# Patient Record
Sex: Female | Born: 1960 | Hispanic: No | Marital: Married
Health system: Northeastern US, Academic
[De-identification: ages and names within clinical notes are randomized; demographics above are authoritative.]

## PROBLEM LIST (undated history)

## (undated) DIAGNOSIS — E039 Hypothyroidism, unspecified: Secondary | ICD-10-CM

## (undated) HISTORY — DX: Hypothyroidism, unspecified: E03.9

---

## 2003-06-13 ENCOUNTER — Other Ambulatory Visit: Admission: RE | Admit: 2003-06-13 | Discharge: 2003-06-13 | Payer: Self-pay | Admitting: Obstetrics and Gynecology

## 2003-09-14 ENCOUNTER — Encounter: Admission: RE | Admit: 2003-09-14 | Discharge: 2003-09-14 | Payer: Self-pay | Admitting: Internal Medicine

## 2004-10-29 ENCOUNTER — Other Ambulatory Visit: Admission: RE | Admit: 2004-10-29 | Discharge: 2004-10-29 | Payer: Self-pay | Admitting: Obstetrics and Gynecology

## 2004-11-01 ENCOUNTER — Encounter: Admission: RE | Admit: 2004-11-01 | Discharge: 2004-11-01 | Payer: Self-pay | Admitting: Internal Medicine

## 2004-11-06 ENCOUNTER — Encounter (INDEPENDENT_AMBULATORY_CARE_PROVIDER_SITE_OTHER): Payer: Self-pay | Admitting: Specialist

## 2004-11-06 ENCOUNTER — Inpatient Hospital Stay (HOSPITAL_COMMUNITY): Admission: RE | Admit: 2004-11-06 | Discharge: 2004-11-08 | Payer: Self-pay | Admitting: Obstetrics and Gynecology

## 2005-05-23 HISTORY — PX: THYROIDECTOMY: SHX17

## 2005-11-20 ENCOUNTER — Other Ambulatory Visit: Admission: RE | Admit: 2005-11-20 | Discharge: 2005-11-20 | Payer: Self-pay | Admitting: Obstetrics and Gynecology

## 2005-11-21 ENCOUNTER — Encounter: Admission: RE | Admit: 2005-11-21 | Discharge: 2005-11-21 | Payer: Self-pay | Admitting: Internal Medicine

## 2006-03-04 ENCOUNTER — Encounter: Admission: RE | Admit: 2006-03-04 | Discharge: 2006-03-04 | Payer: Self-pay | Admitting: Endocrinology

## 2006-12-29 ENCOUNTER — Ambulatory Visit (HOSPITAL_COMMUNITY): Admission: RE | Admit: 2006-12-29 | Discharge: 2006-12-29 | Payer: Self-pay | Admitting: Obstetrics and Gynecology

## 2008-01-25 ENCOUNTER — Ambulatory Visit (HOSPITAL_COMMUNITY): Admission: RE | Admit: 2008-01-25 | Discharge: 2008-01-25 | Payer: Self-pay | Admitting: Internal Medicine

## 2008-12-07 ENCOUNTER — Encounter (HOSPITAL_COMMUNITY): Admission: RE | Admit: 2008-12-07 | Discharge: 2009-03-07 | Payer: Self-pay | Admitting: Endocrinology

## 2009-01-26 ENCOUNTER — Ambulatory Visit (HOSPITAL_COMMUNITY): Admission: RE | Admit: 2009-01-26 | Discharge: 2009-01-26 | Payer: Self-pay | Admitting: Internal Medicine

## 2009-03-02 ENCOUNTER — Encounter (HOSPITAL_COMMUNITY): Admission: RE | Admit: 2009-03-02 | Discharge: 2009-04-26 | Payer: Self-pay | Admitting: Endocrinology

## 2010-04-15 ENCOUNTER — Ambulatory Visit (HOSPITAL_COMMUNITY): Admission: RE | Admit: 2010-04-15 | Discharge: 2010-04-15 | Payer: Self-pay | Admitting: Obstetrics and Gynecology

## 2010-09-15 ENCOUNTER — Encounter: Payer: Self-pay | Admitting: Internal Medicine

## 2010-09-15 ENCOUNTER — Encounter: Payer: Self-pay | Admitting: Endocrinology

## 2010-12-01 LAB — HCG, QUANTITATIVE, PREGNANCY: hCG, Beta Chain, Quant, S: <2 m[IU]/mL (ref <5)

## 2011-01-10 NOTE — H&P (Signed)
NAME:  Traci Reynolds, Traci Reynolds                ACCOUNT NO.:  [id number]   MEDICAL RECORD NO.:  [id number]          PATIENT TYPE:  INP   LOCATION:  NA                            [facility]   PHYSICIAN:  Janine Limbo, M.D.DATE OF BIRTH:  23-Dec-1960   DATE OF ADMISSION:  11/06/2004  DATE OF DISCHARGE:                                HISTORY & PHYSICAL   HISTORY OF PRESENT ILLNESS:  The patient is a 50 year old female, para 0-0-2-  0, who presents for an abdominal myomectomy.  The patient has a known  history of large fibroids.  An ultrasound showed that her fibroids measure  3.2 cm, 2.7 cm, 6.0 cm, and 3.8 cm.  Other fibroids were also noted.  The  patient had an endometrial biopsy performed that was benign.  Her most  recent Pap smear was within normal limits.  The patient complains of heavy  menstrual cycles.  Her TSH, prolactin and FSH have all been normal in the  past.   PAST OBSTETRICAL HISTORY:  The patient has had two elective pregnancy  terminations.   PAST MEDICAL HISTORY:  The patient has had a D&C in the past.  She is  otherwise reasonably healthy.   DRUG ALLERGIES:  No known drug allergies.   SOCIAL HISTORY:  The patient denies cigarette use.   REVIEW OF SYSTEMS:  Please see history of present illness.   FAMILY HISTORY:  The patient has a family history of hypertension.  She also  has a family history of cancer.   ADMISSION PHYSICAL EXAMINATION:  VITAL SIGNS:  Weight is 163 pounds.  HEENT:  Within normal limits.  CHEST:  Clear.  CARDIAC:  Regular rate and rhythm.  BREASTS:  Without masses.  ABDOMEN:  Nontender.  EXTREMITIES:  Within normal limits.  NEUROLOGIC:  Grossly normal.  PELVIC:  External genitalia is normal.  Vagina is normal.  Cervix is  nontender.  The uterus is 16 weeks' size, irregular and nontender.  Adnexa:  No masses.  Rectovaginal exam confirms.   ASSESSMENT:  1.  Fibroid uterus.  2.  Menorrhagia.  3.  Desires to maintain her childbearing  potential.   PLAN:  The patient will undergo an abdominal myomectomy.  She understands  the indications for her procedure and she accepts the risks of, but not  limited to, anesthetic complications, bleeding, infection, and possible  damage to the surrounding organs.      AVS/MEDQ  D:  11/04/2004  T:  11/04/2004  Job:  [id number]

## 2011-01-10 NOTE — Discharge Summary (Signed)
NAME:  Traci Reynolds, Traci Reynolds                ACCOUNT NO.:  [id number]   MEDICAL RECORD NO.:  [id number]          PATIENT TYPE:  INP   LOCATION:  9315                          [facility]ILITY:  [facility]   PHYSICIAN:  Janine Limbo, M.D.DATE OF BIRTH:  07/15/1961   DATE OF ADMISSION:  11/06/2004  DATE OF DISCHARGE:  11/08/2004                                 DISCHARGE SUMMARY   DISCHARGE DIAGNOSES:  1.  Fibroid uterus.  2.  Menorrhagia.  3.  Desire to maintain childbearing potential.   OPERATION:  On the date of admission the patient underwent an abdominal  myomectomy with the placement of a JP drain, tolerating the procedure well.  The patient was found to have a 16-week-size fibroid uterus along with  normal-appearing tubes and ovaries bilaterally. The patient had 9 fibroids  removed from her uterus with the largest measuring 11.5 cm x 9.0 cm.   HISTORY OF PRESENT ILLNESS:  Ms. Dewalt is a 50 year old female para 0-0-2-  0 who presents for abdominal myomectomy because of symptoms uterine  fibroids. Please see the patient's dictated History and Physical Examination  for details.   PREOPERATIVE PHYSICAL EXAMINATION:  VITAL SIGNS:  Weight 163 pounds.  GENERAL:  Within normal limits.  PELVIC:  External genitalia is normal, vagina is normal, cervix is  nontender. Uterus is 15 weeks size, irregular, and nontender. Adnexa without  masses. Rectovaginal exam confirms.   HOSPITAL COURSE:  On the date of admission the patient underwent  aforementioned procedures, tolerating them well. Postoperative course was  marked by a brief time of elevated blood pressure to a maximum of 155/98.  However, this subsided with time. Additionally, the patient had a maximum  temperature of 100.7; however, this is not uncommon with myomectomy and she  quickly defervesced. Postoperative hemoglobin was 9.8 (preoperative  hemoglobin 13.0). The patient, by postoperative day #2, had resumed bowel  and bladder function, was  stable with her decreased hemoglobin, and  therefore deemed ready for discharge home.   DISCHARGE MEDICATIONS:  1.  Phenergan 25 mg one tablet q.6h. as needed for nausea.  2.  Colace 100 mg one tablet twice daily until bowel movements are regular.  3.  Ibuprofen 600 mg one tablet with food q.6h. for 3 days, then as needed      for pain.  4.  Ferrous sulfate 325 mg one tablet twice daily for 6 weeks.  5.  Vicodin one to two tablets q.4-6h. as needed for pain.   FOLLOW-UP:  The patient is scheduled for a 6 weeks postoperative visit with  Dr. Stefano Gaul on December 16, 2004 at 10:30 a.m.   DISCHARGE INSTRUCTIONS:  The patient was given a copy of [facility] postoperative instruction sheet. She was further advised to avoid  driving for 2 weeks, heavy lifting for 4 weeks, and intercourse for 6 weeks.  The patient's diet was without restriction.   FINAL PATHOLOGY:  Myelomas:  Fragments of leiomyomata.      EJP/MEDQ  D:  12/04/2004  T:  12/04/2004  Job:  [id number]

## 2011-01-10 NOTE — Op Note (Signed)
NAME:  Traci Reynolds, Traci Reynolds                ACCOUNT NO.:  [id number]   MEDICAL RECORD NO.:  [id number]          PATIENT TYPE:  INP   LOCATION:  9399                          [facility]:  [facility]   PHYSICIAN:  Janine Limbo, M.D.DATE OF BIRTH:  1961-03-05   DATE OF PROCEDURE:  11/06/2004  DATE OF DISCHARGE:                                 OPERATIVE REPORT   PREOPERATIVE DIAGNOSES:  1.  Fibroid uterus.  2.  Menorrhagia.  3.  Desires to maintain childbearing potential.   POSTOPERATIVE DIAGNOSES:  1.  Fibroid uterus.  2.  Menorrhagia.  3.  Desires to maintain childbearing potential.   PROCEDURE:  Multiple abdominal myomectomy.   SURGEON:  Janine Limbo.  Elmira J. Adline Peals., was the first  assistant.   ANESTHETIC:  General.   DISPOSITION:  Ms. Traci Reynolds is a 50 year old female, para 0-0-2-0, who  presents with the above-mentioned diagnosis.  The patient desires myomectomy because of her above symptoms.  She  understands the indications for  her procedure and she accepts the risk of,  but not limited to, anesthetic complications, bleeding, infection, and  possible damage to the surrounding organs.   FINDINGS:  The patient had a 16-week size fibroid uterus.  A total of nine  fibroids were removed, and the largest fibroid measured 11.5 by 9 cm.  The  fallopian tubes and the ovaries were normal.  The bowel appeared normal.  The myometrium was penetrated deeply as part of our dissection, and  therefore I would recommend that a cesarean section be performed at the time  of delivery of infant.   PROCEDURE:  The patient was taken to the operating room, where a general  anesthetic was given.  The patient's abdomen, perineum, and vagina were  prepped with multiple layers of Betadine.  A Foley catheter was placed in  the bladder.  The patient was sterilely draped.  The lower abdomen was  injected with 0.5% Marcaine with epinephrine.  A low transverse incision was  made in the abdomen and  the incision was carried sharply through the  subcutaneous tissue, the fascia, and the anterior peritoneum.  The fibroid  uterus was elevated into our operative field.  Multiple fibroids were noted  as mentioned above.  We began our procedure by injecting the fibroids on the  anterior separate surface of the uterus with a diluted solution of Pitressin  and saline.  An incision was made in the serosa of the uterus and the  incision was carried down to the level of the fibroid.  The fibroid was then  sharply and bluntly removed from the myometrium.  Three fibroids were  removed from the anterior incision.  The defects in the uterus were then  closed using figure-of-eight sutures of 0 Vicryl.  The serosa of the uterus  was closed using a baseball stitch of 3-0 Vicryl.  We then removed  multiple fibroids from the posterior fundus of the uterus.  The largest  fibroid was located on the posterior fundus of the uterus.  The large  fibroid was injected with a diluted solution of Pitressin  and saline.  The  Bovie cautery was used to make an incision in the serosa of the uterus, and  the large fibroid was removed from the fundus of the uterus.  Multiple other  fibroids were removed from this incision in the uterus.  The defect in the  uterus was again closed using deep sutures of 0 Vicryl.  The serosa of the  uterus was closed using a baseball suture of 3-0 Vicryl.  Care was taken not  to damage the fallopian tubes.  Hemostasis was noted to be adequate at this  point.  The pelvis was vigorously irrigated.  Interceed was placed on all  incisions on the uterus.  The uterus was returned to the pelvis.  Hemostasis  was confirmed throughout.  We closed the anterior peritoneum using a running  suture of 3-0 Vicryl.  The abdominal musculature and the fascia were  irrigated.  The fascia was closed using a running suture of 0 Vicryl,  followed by three interrupted sutures of 0 Vicryl.  A Jackson-Pratt drain   was placed in the subcutaneous layer and brought out through the right lower  quadrant.  It was sutured into place using silk suture.  The subcutaneous  layer was closed using 3-0 Vicryl.  The skin was reapproximated using a  subcuticular suture of 3-0 Monocryl.  Sponge, needle, and instrument counts  were correct on two occasions.  The estimated blood loss for the procedure  was 250 mL.  The patient was at 150 mL of urine output.  Her urine was noted  to be clear.  She received approximately 1700 mL of IV fluids.  The patient  was taken to the recovery room in stable condition.  There were no  complications.      AVS/MEDQ  D:  11/06/2004  T:  11/06/2004  Job:  [id number]

## 2011-02-17 ENCOUNTER — Other Ambulatory Visit: Payer: Self-pay | Admitting: Obstetrics and Gynecology

## 2011-02-17 DIAGNOSIS — Z1231 Encounter for screening mammogram for malignant neoplasm of breast: Secondary | ICD-10-CM

## 2011-04-24 ENCOUNTER — Ambulatory Visit (HOSPITAL_COMMUNITY)
Admission: RE | Admit: 2011-04-24 | Discharge: 2011-04-24 | Disposition: A | Discharge status: Home / Self Care | Payer: BC Managed Care – PPO | Source: Ambulatory Visit | Attending: Obstetrics and Gynecology | Admitting: Obstetrics and Gynecology

## 2011-04-24 DIAGNOSIS — Z1231 Encounter for screening mammogram for malignant neoplasm of breast: Secondary | ICD-10-CM

## 2011-10-23 ENCOUNTER — Ambulatory Visit (INDEPENDENT_AMBULATORY_CARE_PROVIDER_SITE_OTHER): Payer: BC Managed Care – PPO | Admitting: Obstetrics and Gynecology

## 2011-10-23 DIAGNOSIS — Z01419 Encounter for gynecological examination (general) (routine) without abnormal findings: Secondary | ICD-10-CM

## 2012-03-01 ENCOUNTER — Other Ambulatory Visit (HOSPITAL_COMMUNITY): Payer: Self-pay | Admitting: Internal Medicine

## 2012-03-01 DIAGNOSIS — Z1231 Encounter for screening mammogram for malignant neoplasm of breast: Secondary | ICD-10-CM

## 2012-05-06 ENCOUNTER — Ambulatory Visit (HOSPITAL_COMMUNITY)
Admission: RE | Admit: 2012-05-06 | Discharge: 2012-05-06 | Disposition: A | Discharge status: Home / Self Care | Payer: BC Managed Care – PPO | Source: Ambulatory Visit | Attending: Internal Medicine | Admitting: Internal Medicine

## 2012-05-06 DIAGNOSIS — Z1231 Encounter for screening mammogram for malignant neoplasm of breast: Secondary | ICD-10-CM | POA: Insufficient documentation

## 2012-05-10 ENCOUNTER — Encounter: Payer: Self-pay | Admitting: Obstetrics and Gynecology

## 2012-05-13 ENCOUNTER — Telehealth: Payer: Self-pay | Admitting: Obstetrics and Gynecology

## 2012-05-13 NOTE — Telephone Encounter (Signed)
Triage/quest about letter

## 2012-05-13 NOTE — Telephone Encounter (Signed)
TC to pt. States has received dense breast letter. Informed DR AVS is giving free informational sessions.  Will notify his assistant to scheduled at next class.  Pt agreeable.

## 2012-05-17 ENCOUNTER — Telehealth: Payer: Self-pay | Admitting: Obstetrics and Gynecology

## 2012-05-17 NOTE — Telephone Encounter (Signed)
Pt concerned about dense breast tissue letter.  Explained letter and pt states understanding.  ld

## 2012-05-24 ENCOUNTER — Telehealth: Payer: Self-pay

## 2012-05-24 NOTE — Telephone Encounter (Signed)
Pt was called today to schedule her for The Dense Breast Class w/ AVS. Pt states she will not be able to attend 05-27-12 @ 4:30pm b/c she does not live in [place]. She lives out of town but would like to try to attend The next class next month. I will find out from AVS the next seminar I advise the pt not to go and get a MRI just yet. Pt does not have a Hx of breast CA. Pt agreeable.  [facility] CMA

## 2012-10-26 ENCOUNTER — Telehealth: Payer: Self-pay | Admitting: Obstetrics and Gynecology

## 2012-12-14 ENCOUNTER — Other Ambulatory Visit: Payer: Self-pay | Admitting: Endocrinology

## 2012-12-14 ENCOUNTER — Other Ambulatory Visit: Payer: Self-pay | Admitting: Obstetrics and Gynecology

## 2012-12-14 DIAGNOSIS — N951 Menopausal and female climacteric states: Secondary | ICD-10-CM

## 2012-12-14 DIAGNOSIS — Z1231 Encounter for screening mammogram for malignant neoplasm of breast: Secondary | ICD-10-CM

## 2013-05-17 ENCOUNTER — Ambulatory Visit: Payer: BC Managed Care – PPO

## 2013-05-17 ENCOUNTER — Other Ambulatory Visit: Payer: BC Managed Care – PPO

## 2013-05-27 ENCOUNTER — Ambulatory Visit
Admission: RE | Admit: 2013-05-27 | Discharge: 2013-05-27 | Disposition: A | Discharge status: Home / Self Care | Payer: BC Managed Care – PPO | Source: Ambulatory Visit | Attending: Endocrinology | Admitting: Endocrinology

## 2013-05-27 ENCOUNTER — Ambulatory Visit
Admission: RE | Admit: 2013-05-27 | Discharge: 2013-05-27 | Disposition: A | Discharge status: Home / Self Care | Payer: BC Managed Care – PPO | Source: Ambulatory Visit | Attending: Obstetrics and Gynecology | Admitting: Obstetrics and Gynecology

## 2013-05-27 DIAGNOSIS — N951 Menopausal and female climacteric states: Secondary | ICD-10-CM

## 2013-05-27 DIAGNOSIS — Z1231 Encounter for screening mammogram for malignant neoplasm of breast: Secondary | ICD-10-CM

## 2014-06-12 ENCOUNTER — Other Ambulatory Visit: Payer: Self-pay

## 2014-06-12 DIAGNOSIS — Z1239 Encounter for other screening for malignant neoplasm of breast: Secondary | ICD-10-CM

## 2014-07-05 ENCOUNTER — Other Ambulatory Visit: Payer: Self-pay

## 2014-07-05 DIAGNOSIS — Z1231 Encounter for screening mammogram for malignant neoplasm of breast: Secondary | ICD-10-CM

## 2014-07-06 ENCOUNTER — Ambulatory Visit
Admission: RE | Admit: 2014-07-06 | Discharge: 2014-07-06 | Disposition: A | Discharge status: Home / Self Care | Payer: BC Managed Care – PPO | Source: Ambulatory Visit

## 2014-07-06 DIAGNOSIS — Z1231 Encounter for screening mammogram for malignant neoplasm of breast: Secondary | ICD-10-CM

## 2015-10-10 ENCOUNTER — Other Ambulatory Visit: Payer: Self-pay

## 2015-10-10 DIAGNOSIS — Z1231 Encounter for screening mammogram for malignant neoplasm of breast: Secondary | ICD-10-CM

## 2015-11-13 ENCOUNTER — Ambulatory Visit
Admission: RE | Admit: 2015-11-13 | Discharge: 2015-11-13 | Disposition: A | Discharge status: Home / Self Care | Payer: BLUE CROSS/BLUE SHIELD | Source: Ambulatory Visit

## 2015-11-13 DIAGNOSIS — Z1231 Encounter for screening mammogram for malignant neoplasm of breast: Secondary | ICD-10-CM

## 2016-09-17 ENCOUNTER — Other Ambulatory Visit: Payer: Self-pay | Admitting: Internal Medicine

## 2016-09-17 DIAGNOSIS — Z1231 Encounter for screening mammogram for malignant neoplasm of breast: Secondary | ICD-10-CM

## 2017-01-15 ENCOUNTER — Ambulatory Visit
Admission: RE | Admit: 2017-01-15 | Discharge: 2017-01-15 | Disposition: A | Discharge status: Home / Self Care | Payer: BLUE CROSS/BLUE SHIELD | Source: Ambulatory Visit | Attending: Internal Medicine | Admitting: Internal Medicine

## 2017-01-15 DIAGNOSIS — Z1231 Encounter for screening mammogram for malignant neoplasm of breast: Secondary | ICD-10-CM

## 2017-12-16 ENCOUNTER — Other Ambulatory Visit: Payer: Self-pay | Admitting: Internal Medicine

## 2017-12-16 DIAGNOSIS — Z1231 Encounter for screening mammogram for malignant neoplasm of breast: Secondary | ICD-10-CM

## 2018-03-18 ENCOUNTER — Ambulatory Visit: Payer: BLUE CROSS/BLUE SHIELD

## 2018-05-13 ENCOUNTER — Ambulatory Visit
Admission: RE | Admit: 2018-05-13 | Discharge: 2018-05-13 | Disposition: A | Discharge status: Home / Self Care | Payer: BLUE CROSS/BLUE SHIELD | Source: Ambulatory Visit | Attending: Internal Medicine | Admitting: Internal Medicine

## 2018-05-13 DIAGNOSIS — Z1231 Encounter for screening mammogram for malignant neoplasm of breast: Secondary | ICD-10-CM

## 2018-05-14 DIAGNOSIS — Z1231 Encounter for screening mammogram for malignant neoplasm of breast: Secondary | ICD-10-CM

## 2018-05-14 DIAGNOSIS — Z1211 Encounter for screening for malignant neoplasm of colon: Secondary | ICD-10-CM

## 2018-05-14 DIAGNOSIS — Z0000 Encounter for general adult medical examination without abnormal findings: Secondary | ICD-10-CM | POA: Diagnosis not present

## 2018-05-14 DIAGNOSIS — E890 Postprocedural hypothyroidism: Secondary | ICD-10-CM

## 2018-05-26 ENCOUNTER — Other Ambulatory Visit: Payer: Self-pay | Admitting: Internal Medicine

## 2018-06-03 ENCOUNTER — Other Ambulatory Visit: Payer: Self-pay | Admitting: Internal Medicine

## 2018-06-07 ENCOUNTER — Other Ambulatory Visit: Payer: Self-pay | Admitting: Internal Medicine

## 2018-07-07 ENCOUNTER — Other Ambulatory Visit: Payer: Self-pay | Admitting: Internal Medicine

## 2018-08-04 LAB — HM COLONOSCOPY

## 2018-08-06 ENCOUNTER — Other Ambulatory Visit: Payer: Self-pay | Admitting: Internal Medicine

## 2018-09-05 ENCOUNTER — Other Ambulatory Visit: Payer: Self-pay | Admitting: Internal Medicine

## 2018-10-05 ENCOUNTER — Other Ambulatory Visit: Payer: Self-pay | Admitting: Internal Medicine

## 2018-11-04 ENCOUNTER — Other Ambulatory Visit: Payer: Self-pay | Admitting: Internal Medicine

## 2018-11-09 ENCOUNTER — Ambulatory Visit: Payer: Self-pay | Admitting: Nurse Practitioner

## 2018-11-12 ENCOUNTER — Ambulatory Visit: Payer: Self-pay | Admitting: Nurse Practitioner

## 2018-11-30 ENCOUNTER — Other Ambulatory Visit: Payer: Self-pay | Admitting: Internal Medicine

## 2018-12-04 ENCOUNTER — Other Ambulatory Visit: Payer: Self-pay | Admitting: Nurse Practitioner

## 2018-12-06 ENCOUNTER — Telehealth: Payer: Self-pay

## 2018-12-06 NOTE — Telephone Encounter (Signed)
Attempted to call pt to notified her that she needs an appointment before we cab refill her crestor, unable to get in touch with pt incorrect number on file. YRL,RMA

## 2018-12-07 ENCOUNTER — Other Ambulatory Visit: Payer: Self-pay

## 2018-12-07 ENCOUNTER — Encounter: Payer: Self-pay | Admitting: Nurse Practitioner

## 2018-12-07 ENCOUNTER — Ambulatory Visit: Payer: BLUE CROSS/BLUE SHIELD | Admitting: Nurse Practitioner

## 2018-12-07 VITALS — Temp 97.2°F

## 2018-12-07 DIAGNOSIS — R7303 Prediabetes: Secondary | ICD-10-CM | POA: Diagnosis not present

## 2018-12-07 DIAGNOSIS — E039 Hypothyroidism, unspecified: Secondary | ICD-10-CM

## 2018-12-07 DIAGNOSIS — N939 Abnormal uterine and vaginal bleeding, unspecified: Secondary | ICD-10-CM | POA: Diagnosis not present

## 2018-12-07 MED ORDER — ROSUVASTATIN CALCIUM 10 MG PO TABS: 10.0000 mg | ORAL_TABLET | Freq: Every day | ORAL | 0 refills | Status: DC

## 2018-12-07 NOTE — Progress Notes (Signed)
This visit type was conducted due to national recommendations for restrictions regarding the COVID-19 Pandemic (e.g. social distancing).  This format is felt to be most appropriate for this patient at this time.  All issues noted in this document were discussed and addressed.  No physical exam was performed (except for noted visual exam findings with Video Visits).  Please refer to the patient's chart (MyChart message for video visits and phone note for telephone visits) for the patient's consent to telehealth for [facility].   Subjective:     Patient ID: Traci Reynolds , female    DOB: 11/12/1960 , 58 y.o.   MRN: [id number]  Virtual Visit via Telephone Note  I connected with Traci Reynolds on 12/19/18 at 12:00 PM EDT by telephone and verified that I am speaking with the correct person using two identifiers.   I discussed the limitations, risks, security and privacy concerns of performing an evaluation and management service by telephone and the availability of in person appointments. I also discussed with the patient that there may be a patient responsible charge related to this service. The patient expressed understanding and agreed to proceed.  Chief Complaint  Patient presents with  . Hyperlipidemia    History of Present Illness:   She has been having spotting from the vaginal bleeding.  She lives in [place].  She has been postmenopause for 5 years.  No family history of female problems. Occurs every couple of weeks, has light color but does report blood    Hyperlipidemia  This is a chronic problem. The current episode started more than 1 year ago. Recent lipid tests were reviewed and are high. She has no history of chronic renal disease. Pertinent negatives include no chest pain. There are no compliance problems.  There are no known risk factors for coronary artery disease.  Vaginal Discharge  The patient's primary symptoms include vaginal discharge.     No past medical history on  file.   Family History  Problem Relation Age of Onset  . Other Father      Current Outpatient Medications:  .  Cholecalciferol (VITAMIN D-3) 125 MCG (5000 UT) TABS, Take by mouth., Disp: , Rfl:  .  levothyroxine (SYNTHROID, LEVOTHROID) 50 MCG tablet, TAKE 1 TABLET BY MOUTH EVERY DAY, Disp: 90 tablet, Rfl: 0 .  rosuvastatin (CRESTOR) 10 MG tablet, Take 1 tablet (10 mg total) by mouth daily., Disp: 90 tablet, Rfl: 0 .  vitamin E 1000 UNIT capsule, Take 1,000 Units by mouth daily., Disp: , Rfl:    No Known Allergies   Review of Systems  Constitutional: Negative.   Respiratory: Negative.   Cardiovascular: Negative for chest pain.  Endocrine: Negative for polydipsia, polyphagia and polyuria.  Genitourinary: Positive for vaginal discharge.     Today's Vitals   12/07/18 1158  Temp: (!) 97.2 F (36.2 C)  TempSrc: Oral    Observations/Objective: No acute respiratory distress noted. Speech is clear.        Assessment and Plan: 1. Abnormal vaginal bleeding  Reported abnormal vaginal bleeding I will refer to gynecology for further evaluation.  Due to occurring every few weeks  I will check CBC to ensure levels are not low  She is menopausal - Ambulatory referral to Gynecology - CBC no Diff; Future  2. Acquired hypothyroidism  Chronic, controlled  Continue with current medications - TSH; Future - T3; Future - T4, Free; Future  3. Prediabetes  Chronic, stable  No  current medications - Hemoglobin A1c  Follow Up Instructions:   I discussed the assessment and treatment plan with the patient. The patient was provided an opportunity to ask questions and all were answered. The patient agreed with the plan and demonstrated an understanding of the instructions.   The patient was advised to call back or seek an in-person evaluation if the symptoms worsen or if the condition fails to improve as anticipated.  COVID-19 Education: The signs and symptoms of COVID-19 were  discussed with the patient and how to seek care for testing (follow up with PCP or arrange E-visit).  The importance of social distancing was discussed today.   Patient Risk:   After full review of this patients clinical status, I feel that they are at least moderate risk at this time.   I provided 12 minutes of non-face-to-face time during this encounter.   Arnette FeltsJanece Valeska Haislip, FNP

## 2018-12-19 ENCOUNTER — Encounter: Payer: Self-pay | Admitting: Nurse Practitioner

## 2019-02-28 ENCOUNTER — Other Ambulatory Visit: Payer: Self-pay | Admitting: Internal Medicine

## 2019-03-04 ENCOUNTER — Other Ambulatory Visit: Payer: Self-pay | Admitting: Nurse Practitioner

## 2019-04-06 ENCOUNTER — Telehealth: Payer: Self-pay

## 2019-04-06 NOTE — Telephone Encounter (Signed)
Left message for pt to call back. Need to know if pt received results from covid test done at [facility] minute [facility]

## 2019-04-12 ENCOUNTER — Telehealth: Payer: Self-pay

## 2019-04-12 NOTE — Telephone Encounter (Signed)
I called pt to see what her results from her COVID test were she stated they were negative. YRL,RMA

## 2019-05-17 ENCOUNTER — Encounter: Payer: Self-pay | Admitting: Nurse Practitioner

## 2019-05-20 ENCOUNTER — Encounter: Payer: Self-pay | Admitting: Nurse Practitioner

## 2019-05-30 ENCOUNTER — Telehealth: Payer: Self-pay

## 2019-05-30 ENCOUNTER — Other Ambulatory Visit: Payer: Self-pay

## 2019-05-30 MED ORDER — LEVOTHYROXINE SODIUM 50 MCG PO TABS: 50.0000 ug | ORAL_TABLET | Freq: Every day | ORAL | 0 refills | Status: DC

## 2019-05-30 NOTE — Telephone Encounter (Signed)
Pt called for refill. Pt needs an appt 30 days of medications sent but no further refills can be sent until pt has appt

## 2019-06-28 ENCOUNTER — Other Ambulatory Visit: Payer: Self-pay

## 2019-06-28 MED ORDER — LEVOTHYROXINE SODIUM 50 MCG PO TABS: 50.0000 ug | ORAL_TABLET | Freq: Every day | ORAL | 0 refills | Status: DC

## 2019-07-28 ENCOUNTER — Other Ambulatory Visit: Payer: Self-pay

## 2019-08-03 ENCOUNTER — Encounter: Payer: Self-pay | Admitting: Internal Medicine

## 2019-08-03 ENCOUNTER — Other Ambulatory Visit: Payer: Self-pay

## 2019-08-03 ENCOUNTER — Telehealth (INDEPENDENT_AMBULATORY_CARE_PROVIDER_SITE_OTHER): Payer: Self-pay | Admitting: Internal Medicine

## 2019-08-03 VITALS — Ht 64.0 in | Wt 175.0 lb

## 2019-08-03 DIAGNOSIS — E7800 Pure hypercholesterolemia, unspecified: Secondary | ICD-10-CM

## 2019-08-03 DIAGNOSIS — E039 Hypothyroidism, unspecified: Secondary | ICD-10-CM

## 2019-08-03 DIAGNOSIS — Z6830 Body mass index (BMI) 30.0-30.9, adult: Secondary | ICD-10-CM

## 2019-08-03 DIAGNOSIS — E6609 Other obesity due to excess calories: Secondary | ICD-10-CM

## 2019-08-03 DIAGNOSIS — R1013 Epigastric pain: Secondary | ICD-10-CM

## 2019-08-03 MED ORDER — FAMOTIDINE 20 MG PO TABS: 20.0000 mg | ORAL_TABLET | Freq: Every day | ORAL | 1 refills | Status: AC

## 2019-08-03 MED ORDER — LEVOTHYROXINE SODIUM 50 MCG PO TABS: 50.0000 ug | ORAL_TABLET | Freq: Every day | ORAL | 1 refills | Status: DC

## 2019-08-03 MED ORDER — ROSUVASTATIN CALCIUM 10 MG PO TABS: ORAL_TABLET | ORAL | 1 refills | Status: DC

## 2019-08-03 NOTE — Progress Notes (Signed)
Virtual Visit via Video   This visit type was conducted due to national recommendations for restrictions regarding the COVID-19 Pandemic (e.g. social distancing) in an effort to limit this patient's exposure and mitigate transmission in our community.  Due to her co-morbid illnesses, this patient is at least at moderate risk for complications without adequate follow up.  This format is felt to be most appropriate for this patient at this time.  All issues noted in this document were discussed and addressed.  A limited physical exam was performed with this format.    This visit type was conducted due to national recommendations for restrictions regarding the COVID-19 Pandemic (e.g. social distancing) in an effort to limit this patient's exposure and mitigate transmission in our community.  Patients identity confirmed using two different identifiers.  This format is felt to be most appropriate for this patient at this time.  All issues noted in this document were discussed and addressed.  No physical exam was performed (except for noted visual exam findings with Video Visits).    Date:  08/03/2019   ID:  Traci Reynolds, DOB 1961-02-04, MRN [id number]  Patient Location:  Home  Provider location:   Office    Chief Complaint:  "I need refills on my medication"  History of Present Illness:    Traci Reynolds is a 58 y.o. female who presents via video conferencing for a telehealth visit today.    The patient does not have symptoms concerning for COVID-19 infection (fever, chills, cough, or new shortness of breath).   She presents today for virtual visit. She prefers this method of contact due to COVID-19 pandemic.  She does not wish to travel by plane from [place] for an appointment. She presents for f/u hypothyroidism. She reports compliance with meds, but now she is out. She has missed two days - she needs a refill. Her last visit was April 2020.   Thyroid Problem Presents for follow-up  visit. Patient reports no cold intolerance, constipation, depressed mood, palpitations, tremors or visual change. The symptoms have been stable.     History reviewed. No pertinent past medical history. History reviewed. No pertinent surgical history.   Current Meds  Medication Sig  . Cholecalciferol (VITAMIN D-3) 125 MCG (5000 UT) TABS Take by mouth.  . levothyroxine (SYNTHROID) 50 MCG tablet Take 1 tablet (50 mcg total) by mouth daily. Ok to refill  . rosuvastatin (CRESTOR) 10 MG tablet TAKE 1 TABLET(10 MG) BY MOUTH DAILY  . vitamin E 1000 UNIT capsule Take 1,000 Units by mouth daily.  . [DISCONTINUED] levothyroxine (SYNTHROID) 50 MCG tablet Take 1 tablet (50 mcg total) by mouth daily. No additional refills until pt has appointment  . [DISCONTINUED] rosuvastatin (CRESTOR) 10 MG tablet TAKE 1 TABLET(10 MG) BY MOUTH DAILY     Allergies:   Patient has no known allergies.   Social History   Tobacco Use  . Smoking status: Never Smoker  . Smokeless tobacco: Never Used  Substance Use Topics  . Alcohol use: Not Currently  . Drug use: Never     Family Hx: The patient's family history includes Other in her father.  ROS:   Please see the history of present illness.    Review of Systems  Constitutional: Negative.   Respiratory: Negative.   Cardiovascular: Negative.  Negative for palpitations.  Gastrointestinal: Negative.  Negative for constipation.       She c/o intermittent epigastric pain. Unable to determine triggers. Denies belching/heartburn. Described as sharp, shooting.  No relief with bowel movement. Pain eventually subsides. Unable to identify aggravating factors. No associated n/v/d.   Neurological: Negative.  Negative for tremors.  Endo/Heme/Allergies: Negative for cold intolerance.  Psychiatric/Behavioral: Negative.     All other systems reviewed and are negative.   Labs/Other Tests and Data Reviewed:    Recent Labs: No results found for requested labs within last  8760 hours.   Recent Lipid Panel No results found for: CHOL, TRIG, HDL, CHOLHDL, LDLCALC, LDLDIRECT  Wt Readings from Last 3 Encounters:  08/03/19 175 lb (79.4 kg)     Exam:    Vital Signs:  Ht 5\' 4"  (1.626 m)   Wt 175 lb (79.4 kg)   BMI 30.04 kg/m     Physical Exam  Constitutional: She is oriented to person, place, and time and well-developed, well-nourished, and in no distress.  HENT:  Head: Normocephalic and atraumatic.  Neck: Normal range of motion.  Pulmonary/Chest: Effort normal.  Neurological: She is alert and oriented to person, place, and time.  Psychiatric: Affect normal.  Nursing note and vitals reviewed.   ASSESSMENT & PLAN:     1. Acquired hypothyroidism  Chronic. She agrees to go to for [employer]. I will check a thyroid panel and adjust meds as needed. She is advised to wait two weeks before getting her bloodwork, since she recently missed two doses.  2. Pure hypercholesterolemia  Chronic, she reports compliance with medication. Refill sent to the pharmacy.   3. Epigastric pain  Intermittent. She was given rx pepcid 20mg  to take once daily for the next 30 days. She will let me know if her symptoms persist.     4. Class 1 obesity due to excess calories with serious comorbidity and body mass index (BMI) of 30.0 to 30.9 in adult  Importance of regular exercise was stressed to the patient. She is encouraged to aim for 150 minutes per week.    COVID-19 Education: The signs and symptoms of COVID-19 were discussed with the patient and how to seek care for testing (follow up with PCP or arrange E-visit).  The importance of social distancing was discussed today.  Patient Risk:   After full review of this patients clinical status, I feel that they are at least moderate risk at this time.     Medication Adjustments/Labs and Tests Ordered: Current medicines are reviewed at length with the patient today.  Concerns regarding medicines are outlined above.    Tests Ordered: No orders of the defined types were placed in this encounter.   Medication Changes: Meds ordered this encounter  Medications  . levothyroxine (SYNTHROID) 50 MCG tablet    Sig: Take 1 tablet (50 mcg total) by mouth daily. Ok to refill    Dispense:  90 tablet    Refill:  1  . rosuvastatin (CRESTOR) 10 MG tablet    Sig: TAKE 1 TABLET(10 MG) BY MOUTH DAILY    Dispense:  90 tablet    Refill:  1  . famotidine (PEPCID) 20 MG tablet    Sig: Take 1 tablet (20 mg total) by mouth daily.    Dispense:  30 tablet    Refill:  1    Disposition:  Follow up in 6 month(s)  Signed, [employer], MD

## 2019-08-03 NOTE — Patient Instructions (Signed)
Exercising to Stay Healthy To become healthy and stay healthy, it is recommended that you do moderate-intensity and vigorous-intensity exercise. You can tell that you are exercising at a moderate intensity if your heart starts beating faster and you start breathing faster but can still hold a conversation. You can tell that you are exercising at a vigorous intensity if you are breathing much harder and faster and cannot hold a conversation while exercising. Exercising regularly is important. It has many health benefits, such as:  Improving overall fitness, flexibility, and endurance.  Increasing bone density.  Helping with weight control.  Decreasing body fat.  Increasing muscle strength.  Reducing stress and tension.  Improving overall health. How often should I exercise? Choose an activity that you enjoy, and set realistic goals. Your health care provider can help you make an activity plan that works for you. Exercise regularly as told by your health care provider. This may include:  Doing strength training two times a week, such as: ? Lifting weights. ? Using resistance bands. ? Push-ups. ? Sit-ups. ? Yoga.  Doing a certain intensity of exercise for a given amount of time. Choose from these options: ? A total of 150 minutes of moderate-intensity exercise every week. ? A total of 75 minutes of vigorous-intensity exercise every week. ? A mix of moderate-intensity and vigorous-intensity exercise every week. Children, pregnant women, people who have not exercised regularly, people who are overweight, and older adults may need to talk with a health care provider about what activities are safe to do. If you have a medical condition, be sure to talk with your health care provider before you start a new exercise program. What are some exercise ideas? Moderate-intensity exercise ideas include:  Walking 1 mile (1.6 km) in about 15 minutes.  Biking.  Hiking.  Golfing.  Dancing.   Water aerobics. Vigorous-intensity exercise ideas include:  Walking 4.5 miles (7.2 km) or more in about 1 hour.  Jogging or running 5 miles (8 km) in about 1 hour.  Biking 10 miles (16.1 km) or more in about 1 hour.  Lap swimming.  Roller-skating or in-line skating.  Cross-country skiing.  Vigorous competitive sports, such as football, basketball, and soccer.  Jumping rope.  Aerobic dancing. What are some everyday activities that can help me to get exercise?  Yard work, such as: ? Pushing a lawn mower. ? Raking and bagging leaves.  Washing your car.  Pushing a stroller.  Shoveling snow.  Gardening.  Washing windows or floors. How can I be more active in my day-to-day activities?  Use stairs instead of an elevator.  Take a walk during your lunch break.  If you drive, park your car farther away from your work or school.  If you take public transportation, get off one stop early and walk the rest of the way.  Stand up or walk around during all of your indoor phone calls.  Get up, stretch, and walk around every 30 minutes throughout the day.  Enjoy exercise with a friend. Support to continue exercising will help you keep a regular routine of activity. What guidelines can I follow while exercising?  Before you start a new exercise program, talk with your health care provider.  Do not exercise so much that you hurt yourself, feel dizzy, or get very short of breath.  Wear comfortable clothes and wear shoes with good support.  Drink plenty of water while you exercise to prevent dehydration or heat stroke.  Work out until your breathing   and your heartbeat get faster. Where to find more information  [place].S[place] Department of Health and Human Services: www.hhs.gov  Centers for Disease Control and Prevention (CDC): www.cdc.gov Summary  Exercising regularly is important. It will improve your overall fitness, flexibility, and endurance.  Regular exercise also will  improve your overall health. It can help you control your weight, reduce stress, and improve your bone density.  Do not exercise so much that you hurt yourself, feel dizzy, or get very short of breath.  Before you start a new exercise program, talk with your health care provider. This information is not intended to replace advice given to you by your health care provider. Make sure you discuss any questions you have with your health care provider. Document Released: 09/13/2010 Document Revised: 07/24/2017 Document Reviewed: 07/02/2017 Elsevier Patient Education  2020 Elsevier Inc.  

## 2019-08-05 ENCOUNTER — Encounter: Payer: Self-pay | Admitting: Internal Medicine

## 2019-08-09 ENCOUNTER — Telehealth: Payer: Self-pay

## 2019-08-09 NOTE — Telephone Encounter (Signed)
The pt was notified that only one page was uploaded on her [place] and it says to sign the attached form and no form was attached.  The pt said that the page that she sent is what she needs sign and dated.

## 2019-08-30 LAB — BASIC METABOLIC PANEL
BUN: 13 (ref 4–21)
CO2: 27 — AB (ref 13–22)
Chloride: 101 (ref 99–108)
Creatinine: 0.7 (ref 0.5–1.1)
Glucose: 67
Potassium: 4 (ref 3.4–5.3)
Sodium: 136 — AB (ref 137–147)

## 2019-08-30 LAB — LIPID PANEL
Cholesterol: 228 — AB (ref 0–200)
HDL: 48 (ref 35–70)
LDL Cholesterol: 161
LDl/HDL Ratio: 4.8
Triglycerides: 83 (ref 40–160)

## 2019-08-30 LAB — CBC AND DIFFERENTIAL
HCT: 41 (ref 36–46)
Hemoglobin: 13.7 (ref 12.0–16.0)
Platelets: 266 (ref 150–399)
WBC: 4.2

## 2019-08-30 LAB — HEPATIC FUNCTION PANEL
ALT: 17 (ref 7–35)
AST: 17 (ref 13–35)
Alkaline Phosphatase: 56 (ref 25–125)
Bilirubin, Total: 0.7

## 2019-08-30 LAB — COMPREHENSIVE METABOLIC PANEL
Albumin: 4.2 (ref 3.5–5.0)
Calcium: 9.4 (ref 8.7–10.7)
GFR calc Af Amer: 108
GFR calc non Af Amer: 94
Globulin: 3.4

## 2019-08-30 LAB — TSH: TSH: 1.38 (ref 0.41–5.90)

## 2019-08-30 LAB — CBC: RBC: 4.66 (ref 3.87–5.11)

## 2019-08-30 LAB — HEMOGLOBIN A1C: Hemoglobin A1C: 5.5

## 2019-09-01 LAB — HM HEPATITIS C SCREENING LAB: HM Hepatitis Screen: POSITIVE

## 2019-09-03 ENCOUNTER — Encounter: Payer: Self-pay | Admitting: Internal Medicine

## 2019-09-04 ENCOUNTER — Other Ambulatory Visit: Payer: Self-pay | Admitting: Internal Medicine

## 2019-09-04 DIAGNOSIS — R768 Other specified abnormal immunological findings in serum: Secondary | ICD-10-CM

## 2019-09-04 IMAGING — MG DIGITAL SCREENING BILATERAL MAMMOGRAM WITH TOMO AND CAD
8 series · 8 of 24 positions shown · non-contrast
Comparison: Previous exam(s).

CLINICAL DATA: Screening.

EXAM:
DIGITAL SCREENING BILATERAL MAMMOGRAM WITH TOMO AND CAD

[L CC synth-2D]
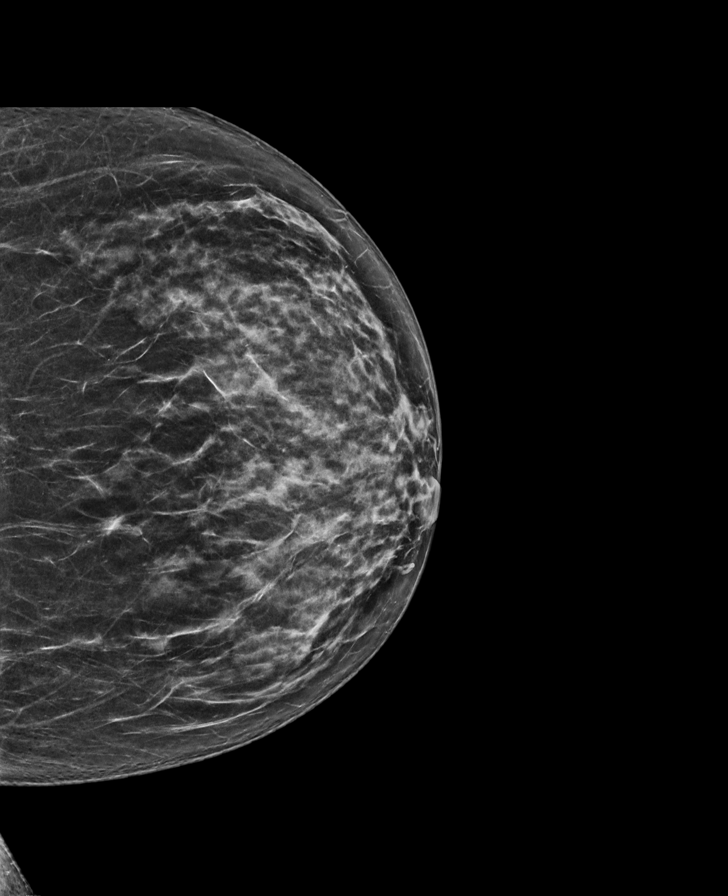

[R CC synth-2D]
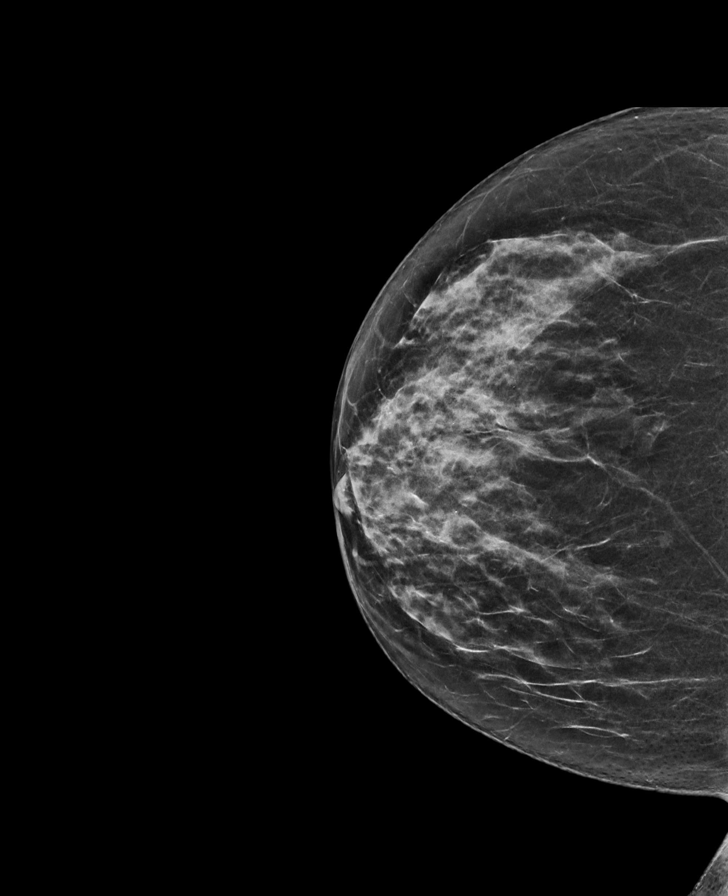

[R MLO synth-2D]
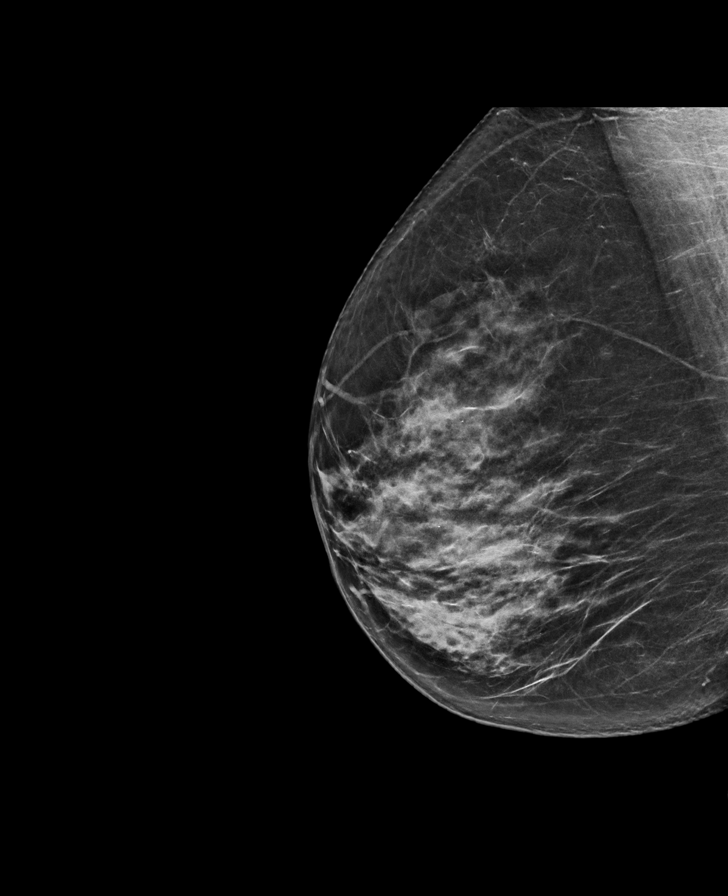

[L MLO synth-2D]
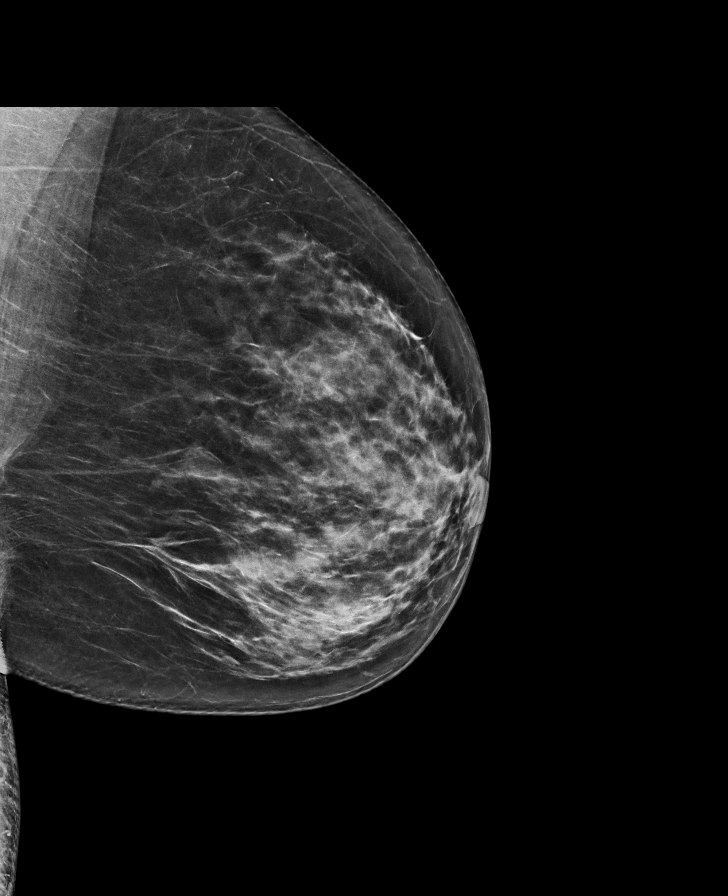

[L MLO tomo · tomo slice 41/80.0]
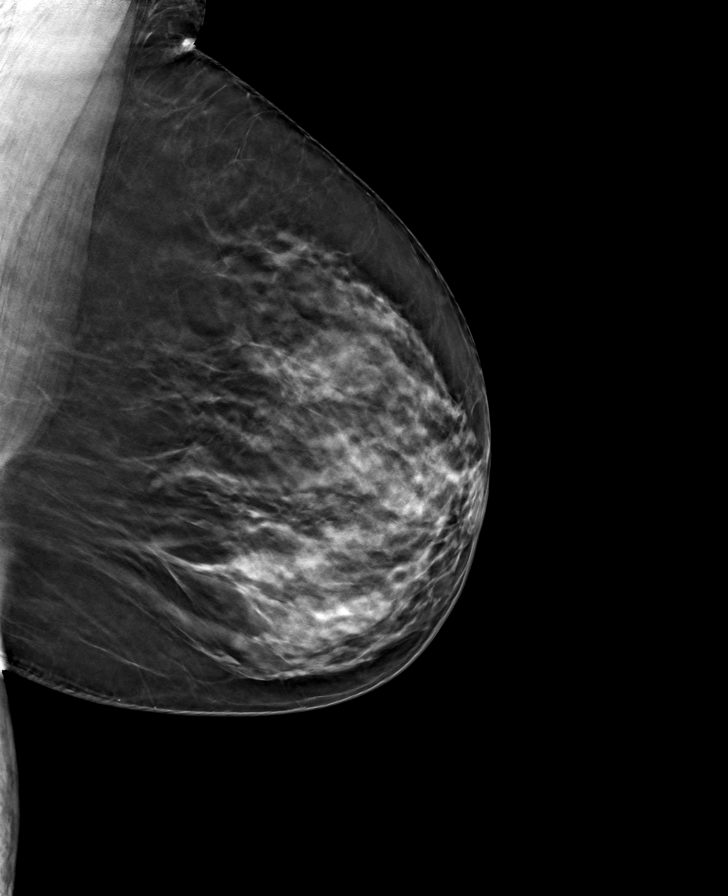

[R MLO tomo · tomo slice 37/73.0]
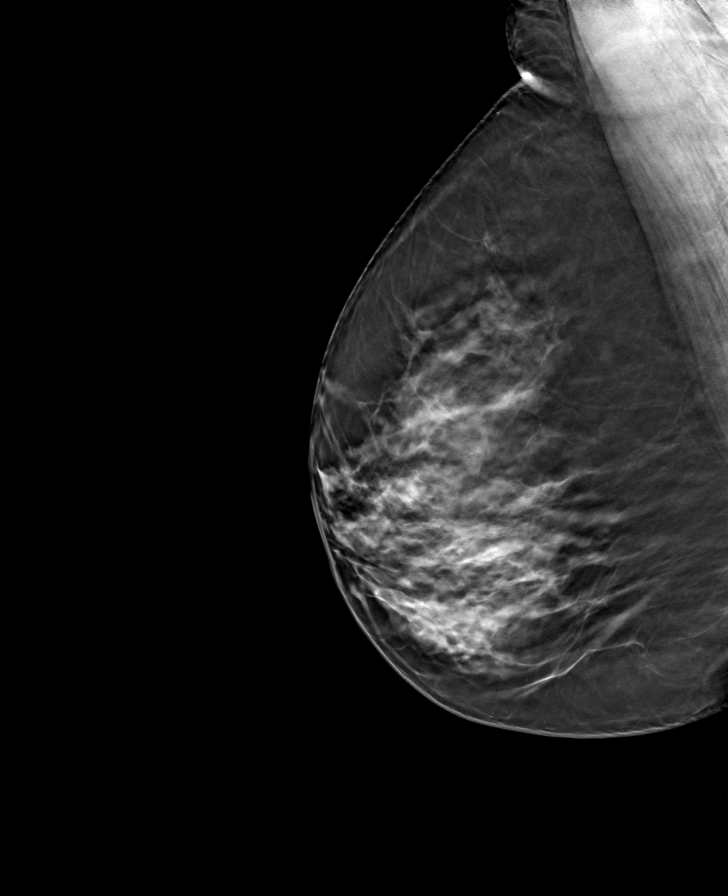

[R CC tomo · tomo slice 33/66.0]
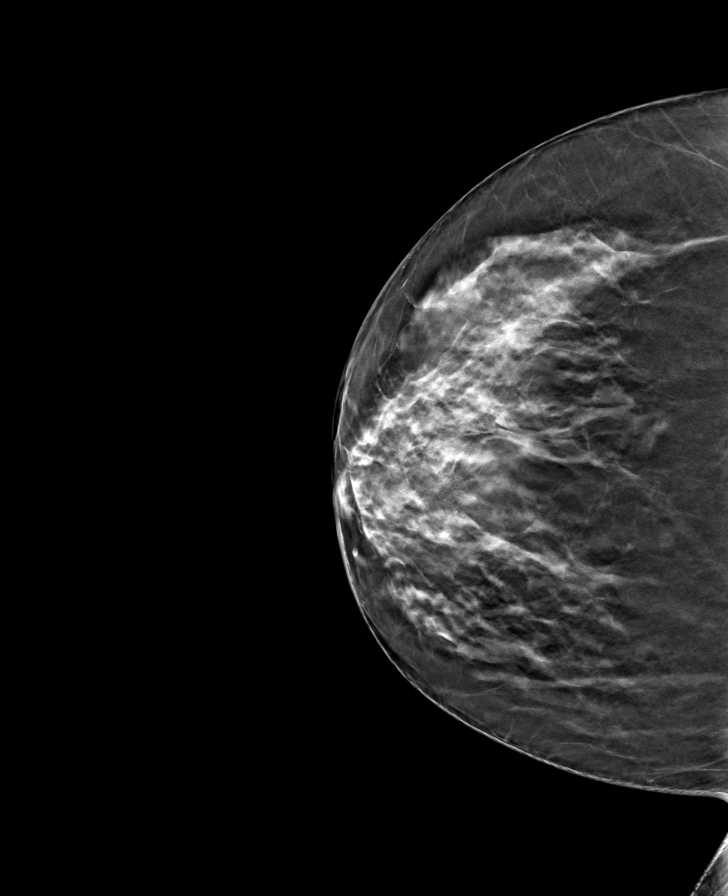

[L CC tomo · tomo slice 35/70.0]
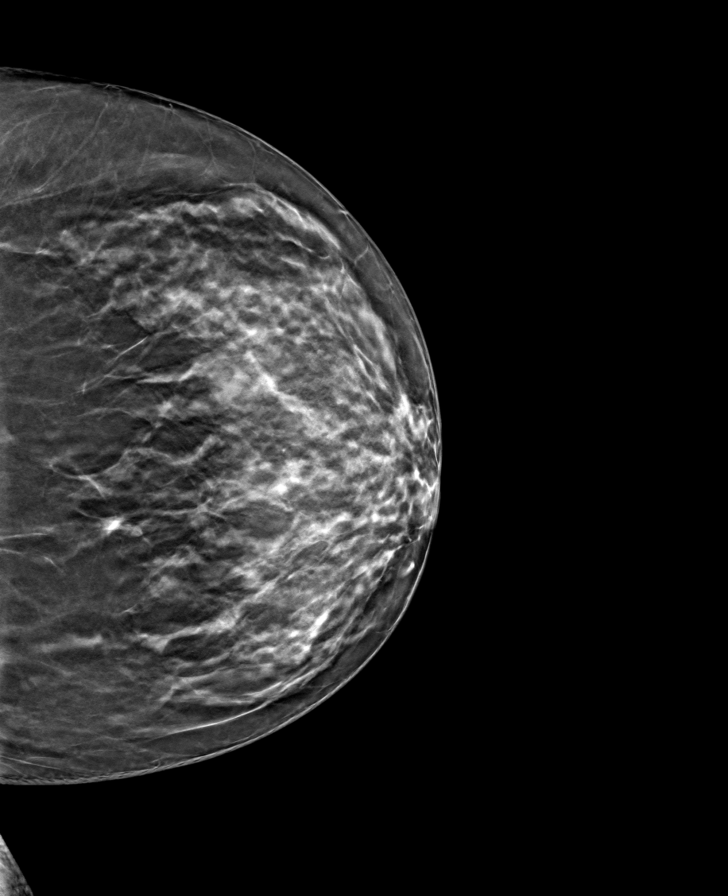

[8 of 24 positions shown; findings below may reference images not displayed]

ACR Breast Density Category c: The breast tissue is heterogeneously
dense, which may obscure small masses.
FINDINGS: There are no findings suspicious for malignancy. Images were
processed with CAD.
IMPRESSION: No mammographic evidence of malignancy. A result letter of this
screening mammogram will be mailed directly to the patient.

RECOMMENDATION:
Screening mammogram in one year. (Code:[id number])

BI-RADS CATEGORY  1: Negative.

## 2019-09-04 MED ORDER — ROSUVASTATIN CALCIUM 20 MG PO TABS: 20.0000 mg | ORAL_TABLET | Freq: Every day | ORAL | 2 refills | Status: DC

## 2019-09-04 NOTE — Progress Notes (Signed)
cres

## 2019-09-05 ENCOUNTER — Encounter: Payer: Self-pay | Admitting: Internal Medicine

## 2019-09-06 ENCOUNTER — Encounter: Payer: Self-pay | Admitting: Internal Medicine

## 2019-09-07 ENCOUNTER — Encounter: Payer: Self-pay | Admitting: Internal Medicine

## 2019-09-17 ENCOUNTER — Encounter: Payer: Self-pay | Admitting: Internal Medicine

## 2019-09-19 ENCOUNTER — Telehealth: Payer: Self-pay

## 2019-09-19 NOTE — Telephone Encounter (Signed)
PT NOTIFIED THAT CHEST XRAY WAS NEGATIVE

## 2019-09-20 LAB — HM MAMMOGRAPHY

## 2019-09-21 ENCOUNTER — Encounter: Payer: Self-pay | Admitting: Internal Medicine

## 2019-09-27 ENCOUNTER — Encounter: Payer: Self-pay | Admitting: Internal Medicine

## 2020-01-26 ENCOUNTER — Other Ambulatory Visit: Payer: Self-pay | Admitting: Internal Medicine

## 2020-02-01 ENCOUNTER — Ambulatory Visit: Payer: Self-pay | Admitting: Internal Medicine

## 2020-03-14 ENCOUNTER — Other Ambulatory Visit: Payer: Self-pay

## 2020-03-14 ENCOUNTER — Encounter: Payer: Self-pay | Admitting: Internal Medicine

## 2020-03-14 ENCOUNTER — Ambulatory Visit: Payer: Self-pay | Admitting: Internal Medicine

## 2020-03-14 VITALS — BP 110/64 | HR 78 | Temp 98.5°F | Ht 64.2 in | Wt 180.2 lb

## 2020-03-14 DIAGNOSIS — Z6830 Body mass index (BMI) 30.0-30.9, adult: Secondary | ICD-10-CM

## 2020-03-14 DIAGNOSIS — R7309 Other abnormal glucose: Secondary | ICD-10-CM

## 2020-03-14 DIAGNOSIS — R768 Other specified abnormal immunological findings in serum: Secondary | ICD-10-CM

## 2020-03-14 DIAGNOSIS — L659 Nonscarring hair loss, unspecified: Secondary | ICD-10-CM

## 2020-03-14 DIAGNOSIS — E890 Postprocedural hypothyroidism: Secondary | ICD-10-CM

## 2020-03-14 DIAGNOSIS — E6609 Other obesity due to excess calories: Secondary | ICD-10-CM

## 2020-03-14 NOTE — Patient Instructions (Signed)
Hepatitis C Hepatitis C is a liver infection that is caused by the hepatitis C virus (HCV). The virus infects and causes inflammation in the liver. Hepatitis C can lead to:  A condition where the liver cannot work anymore (liver failure).  Scarring of the liver (cirrhosis).  Liver cancer. People with hepatitis C often do not know for months or years that they have it. This is because they often do not have symptoms or may have only mild symptoms. What are the causes? This condition is caused by HCV. The virus can spread from person to person (is contagious) through:  Contact with an infected person's blood, semen, or vaginal fluids.  Childbirth. A woman who has hepatitis C can pass it to her baby during birth.  Donated blood (blood transfusion) or a donated body organ (organ transplantation) if received in the [place] before 1992. What increases the risk? The following factors may make you more likely to develop this condition:  Having contact with needles or syringes that have HCV on them (are contaminated). Contact may happen while: ? Receiving acupuncture. ? Getting a tattoo. ? Getting a body piercing. ? Injecting drugs.  Having sex with someone who is infected. The virus can spread through vaginal, oral, or anal sex.  Receiving treatment to filter your blood (kidney dialysis).  Having HIV (human immunodeficiency virus) or AIDS (acquired immunodeficiency syndrome).  Having a job that involves contact with blood or certain other body fluids, such as in health care. What are the signs or symptoms? Symptoms of this condition include:  Tiredness (fatigue).  Loss of appetite.  Nausea or vomiting.  Pain in your abdomen.  Dark yellow urine.  Your skin or the white parts of your eyes turning yellow (jaundice).  Itchy skin.  Light-colored or tan stool.  Joint pain.  Bleeding and bruising that happen often.  Fluid building up in your stomach (ascites). Often,  hepatitis C causes no symptoms. How is this diagnosed? This condition is diagnosed with:  Blood tests.  Other tests of how well your liver is working. These may include: ? Magnetic resonance elastography (MRE). This imaging test uses MRI and sound waves to measure liver stiffness. ? Transient elastography. This imaging test uses ultrasound to measure liver stiffness. ? Liver biopsy. This test involves taking a tissue sample from your liver to look at under a microscope. How is this treated? Treatment may depend on how severe your condition is, how long it has lasted, and whether you have liver damage. More testing may be done to figure out the best treatment. Treatment may include:  Taking antiviral medicines and other medicines.  Having follow-up treatments every 6-12 months for infections or other liver problems.  Having liver transplantation. Follow these instructions at home: Medicines  Take over-the-counter and prescription medicines only as told by your health care provider.  If you were prescribed an antiviral medicine, take it as told by your health care provider. Do not stop using the antiviral even if you start to feel better.  Do not take any new medicines, including over-the-counter medicines or supplements, unless your health care provider approves. Activity  Rest as needed.  Do not have sex unless approved by your health care provider.  Return to your normal activities as told by your health care provider. Ask your health care provider what activities are safe for you.  Ask your health care provider when you may return to school or work. Eating and drinking   Eat a balanced diet   with plenty of fruits and vegetables, whole grains, and lean meats or non-meat proteins (such as beans or tofu).  Drink enough fluids to keep your urine pale yellow.  Do not drink alcohol. General instructions  Do not share toothbrushes, nail clippers, or razors.  Wash your hands  often with soap and water for at least 20 seconds. If soap and water are not available, use hand sanitizer.  Cover any cuts or open sores on your skin to prevent spreading HCV.  Avoid swimming or using hot tubs if you have open sores or wounds.  Keep all follow-up visits as told by your health care provider. This is important. You may need follow-up visits every 6-12 months. How is this prevented? There is no vaccine for hepatitis C. The only way to prevent this infection is to lessen your risk of coming into contact with HCV. Make sure you:  Wash your hands often with soap and water for at least 20 seconds.  Do not share needles or syringes.  Use a condom every time you have vaginal, oral, or anal sex. Be sure to use it correctly each time. ? Both females and males should wear condoms. ? Condoms should be kept in place from the beginning to the end of sexual activity. ? Latex condoms should be used, if possible. These offer the best protection.  Avoid handling blood or other body fluids without gloves or other protection.  Avoid getting tattoos or body piercings in shops or other places that are not clean. Where to find more information  Centers for Disease Control and Prevention: www.cdc.gov/hepatitis Contact a health care provider if you:  Have a fever.  Have pain in your abdomen.  Pass dark urine.  Pass light-colored or tan stool.  Have joint pain. Get help right away if you:  Have an increase in fatigue or weakness.  Lose your appetite.  Cannot eat or drink without vomiting.  Develop jaundice or your jaundice gets worse.  Bruise or bleed easily. Summary  Hepatitis C is a liver infection that is caused by the hepatitis C virus (HCV). This infection can lead to a condition where the liver cannot work anymore (liver failure), scarring of the liver (cirrhosis), or liver cancer.  HCV causes this condition and can spread from person to person (is contagious).  Do  not take any medicines, including over-the-counter medicines or supplements, unless your health care provider approves. This information is not intended to replace advice given to you by your health care provider. Make sure you discuss any questions you have with your health care provider. Document Revised: 05/04/2019 Document Reviewed: 04/11/2019 Elsevier Patient Education  2020 Elsevier Inc.  

## 2020-03-14 NOTE — Progress Notes (Signed)
I,Tianna Badgett,acting as a Neurosurgeon for Gwynneth Aliment, MD.,have documented all relevant documentation on the behalf of Gwynneth Aliment, MD,as directed by  Gwynneth Aliment, MD while in the presence of Gwynneth Aliment, MD.  This visit occurred during the SARS-CoV-2 public health emergency.  Safety protocols were in place, including screening questions prior to the visit, additional usage of staff PPE, and extensive cleaning of exam room while observing appropriate contact time as indicated for disinfecting solutions.  Subjective:     Patient ID: Traci Reynolds , female    DOB: Jan 27, 1961 , 59 y.o.   MRN: [id number]   Chief Complaint  Patient presents with  . Hypothyroidism    HPI  . She presents for f/u hypothyroidism. She reports compliance with meds.  Thyroid Problem Presents for follow-up visit. Patient reports no cold intolerance, constipation, depressed mood, palpitations, tremors or visual change. The symptoms have been stable.     Past Medical History:  Diagnosis Date  . Hypothyroidism      Family History  Problem Relation Age of Onset  . Other Father      Current Outpatient Medications:  .  Cholecalciferol (VITAMIN D-3) 125 MCG (5000 UT) TABS, Take by mouth., Disp: , Rfl:  .  famotidine (PEPCID) 20 MG tablet, Take 1 tablet (20 mg total) by mouth daily., Disp: 30 tablet, Rfl: 1 .  levothyroxine (SYNTHROID) 50 MCG tablet, TAKE 1 TABLET BY MOUTH DAILY, Disp: 90 tablet, Rfl: 1 .  rosuvastatin (CRESTOR) 20 MG tablet, Take 1 tablet (20 mg total) by mouth at bedtime., Disp: 90 tablet, Rfl: 2 .  vitamin E 1000 UNIT capsule, Take 1,000 Units by mouth daily., Disp: , Rfl:    No Known Allergies   Review of Systems  Constitutional: Negative.   Respiratory: Negative.   Cardiovascular: Negative.  Negative for palpitations.  Gastrointestinal: Negative.  Negative for constipation.  Endocrine: Negative for cold intolerance.  Skin:       She c/o hair loss.  She is now wearing a  wig. She is not sure what may have contributed to her sx. No h/o lupus.   Neurological: Negative.  Negative for tremors.     Today's Vitals   03/14/20 1600  BP: 110/64  Pulse: 78  Temp: 98.5 F (36.9 C)  TempSrc: Oral  Weight: 180 lb 3.2 oz (81.7 kg)  Height: 5' 4.2" (1.631 m)  PainSc: 0-No pain   Body mass index is 30.74 kg/m.   Objective:  Physical Exam Vitals and nursing note reviewed.  Constitutional:      Appearance: Normal appearance.  HENT:     Head: Normocephalic and atraumatic.  Cardiovascular:     Rate and Rhythm: Normal rate and regular rhythm.     Heart sounds: Normal heart sounds.  Pulmonary:     Effort: Pulmonary effort is normal.     Breath sounds: Normal breath sounds.  Skin:    General: Skin is warm.     Comments: She has areas of alopecia, some of this possibly due to friction since she wears wigs frequently.   Neurological:     General: No focal deficit present.     Mental Status: She is alert.  Psychiatric:        Mood and Affect: Mood normal.        Behavior: Behavior normal.         Assessment And Plan:     1. Postablative hypothyroidism  I will check thyroid panel and adjust  meds as needed.  - TSH - T4, Free  2. Positive hepatitis C antibody test  Previous HCV antibody was positive. Unfortunately, she has yet to see hepatologist. I will check viral load today, along with LFTs.   - CMP w [facility] (STAT/Sunquest-performed on-site) - Hepatitis C RNA quantitative  3. Other abnormal glucose  HER A1C HAS BEEN ELEVATED IN THE PAST. I WILL CHECK AN A1C, BMET TODAY. SHE WAS ENCOURAGED TO AVOID SUGARY BEVERAGES AND PROCESSED FOODS INCLUDNG BREADS, RICE AND PASTA.  - Hemoglobin A1c  4. Hair loss  Comments: I will check labs as listed below. I plan to refer her to [facility], she wants to see one in [place]. She will call back with the info so referral can be placed.  - ANA - Ferritin - CBC no Diff  5. Class 1 obesity due to excess  calories with serious comorbidity and body mass index (BMI) of 30.0 to 30.9 in adult   She is encouraged to strive for BMI less than 27 to decrease cardiac risk. Advised to aim for at least 150 minutes of exercise per week.  Wt Readings from Last 3 Encounters:  03/14/20 180 lb 3.2 oz (81.7 kg)  08/03/19 175 lb (79.4 kg)     Patient was given opportunity to ask questions. Patient verbalized understanding of the plan and was able to repeat key elements of the plan. All questions were answered to their satisfaction.  Gwynneth Aliment, MD   I, Gwynneth Aliment, MD, have reviewed all documentation for this visit. The documentation on 03/18/20 for the exam, diagnosis, procedures, and orders are all accurate and complete.  THE PATIENT IS ENCOURAGED TO PRACTICE SOCIAL DISTANCING DUE TO THE COVID-19 PANDEMIC.

## 2020-03-18 ENCOUNTER — Encounter: Payer: Self-pay | Admitting: Internal Medicine

## 2020-03-18 LAB — ANTI-NUCLEAR AB-TITER (ANA TITER): ANA Titer 1: 1:80 {titer} — ABNORMAL HIGH

## 2020-03-18 LAB — T4, FREE: Free T4: 1.2 ng/dL (ref 0.8–1.8)

## 2020-03-18 LAB — COMPREHENSIVE METABOLIC PANEL
AG Ratio: 1.3 (calc) (ref 1.0–2.5)
ALT: 15 U/L (ref 6–29)
AST: 15 U/L (ref 10–35)
Albumin: 4.3 g/dL (ref 3.6–5.1)
Alkaline phosphatase (APISO): 56 U/L (ref 37–153)
BUN: 16 mg/dL (ref 7–25)
CO2: 25 mmol/L (ref 20–32)
Calcium: 9.4 mg/dL (ref 8.6–10.4)
Chloride: 106 mmol/L (ref 98–110)
Creat: 0.77 mg/dL (ref 0.50–1.05)
Globulin: 3.4 g/dL (calc) (ref 1.9–3.7)
Glucose, Bld: 76 mg/dL (ref 65–99)
Potassium: 4 mmol/L (ref 3.5–5.3)
Sodium: 140 mmol/L (ref 135–146)
Total Bilirubin: 0.3 mg/dL (ref 0.2–1.2)
Total Protein: 7.7 g/dL (ref 6.1–8.1)

## 2020-03-18 LAB — CBC
HCT: 42.3 % (ref 35.0–45.0)
Hemoglobin: 14.2 g/dL (ref 11.7–15.5)
MCH: 29.5 pg (ref 27.0–33.0)
MCHC: 33.6 g/dL (ref 32.0–36.0)
MCV: 87.9 fL (ref 80.0–100.0)
MPV: 9.9 fL (ref 7.5–12.5)
Platelets: 255 10*3/uL (ref 140–400)
RBC: 4.81 10*6/uL (ref 3.80–5.10)
RDW: 13 % (ref 11.0–15.0)
WBC: 5.1 10*3/uL (ref 3.8–10.8)

## 2020-03-18 LAB — HEMOGLOBIN A1C
Hgb A1c MFr Bld: 5.7 % of total Hgb — ABNORMAL HIGH (ref <5.7)
Mean Plasma Glucose: 117 (calc)
eAG (mmol/L): 6.5 (calc)

## 2020-03-18 LAB — HEPATITIS C RNA QUANTITATIVE
HCV Quantitative Log: <1.18 Log IU/mL
HCV RNA, PCR, QN: <15 IU/mL

## 2020-03-18 LAB — ANA: Anti Nuclear Antibody (ANA): POSITIVE — AB

## 2020-03-18 LAB — FERRITIN: Ferritin: 146 ng/mL (ref 16–232)

## 2020-03-18 LAB — TSH: TSH: 1.63 mIU/L (ref 0.40–4.50)

## 2020-03-19 ENCOUNTER — Encounter: Payer: Self-pay | Admitting: Internal Medicine

## 2020-03-26 ENCOUNTER — Other Ambulatory Visit: Payer: Self-pay

## 2020-03-26 ENCOUNTER — Other Ambulatory Visit: Payer: Self-pay | Admitting: Internal Medicine

## 2020-03-26 DIAGNOSIS — L659 Nonscarring hair loss, unspecified: Secondary | ICD-10-CM

## 2020-03-26 DIAGNOSIS — R768 Other specified abnormal immunological findings in serum: Secondary | ICD-10-CM

## 2020-04-18 ENCOUNTER — Encounter: Admit: 2020-04-18 | Payer: PRIVATE HEALTH INSURANCE

## 2020-07-24 ENCOUNTER — Other Ambulatory Visit: Payer: Self-pay | Admitting: Internal Medicine

## 2020-07-30 ENCOUNTER — Encounter: Admit: 2020-07-30 | Payer: PRIVATE HEALTH INSURANCE | Attending: Rheumatology

## 2020-07-30 ENCOUNTER — Ambulatory Visit: Admit: 2020-07-30 | Payer: BLUE CROSS/BLUE SHIELD | Attending: Rheumatology

## 2020-07-30 DIAGNOSIS — Z8639 Personal history of other endocrine, nutritional and metabolic disease: Secondary | ICD-10-CM

## 2020-07-30 DIAGNOSIS — R768 Other specified abnormal immunological findings in serum: Secondary | ICD-10-CM

## 2020-07-30 DIAGNOSIS — L659 Nonscarring hair loss, unspecified: Secondary | ICD-10-CM

## 2020-07-30 MED ORDER — VITAMIN E ORAL
Status: AC
Start: 2020-07-30 — End: ?

## 2020-07-30 MED ORDER — ROSUVASTATIN 20 MG TABLET
20 mg | ORAL | Status: AC
Start: 2020-07-30 — End: ?

## 2020-07-30 MED ORDER — CHOLECALCIFEROL (VITAMIN D3) 125 MCG (5,000 UNIT) TABLET
125 mcg (5,000 unit) | Freq: Every day | ORAL | Status: AC
Start: 2020-07-30 — End: ?

## 2020-07-30 MED ORDER — LEVOTHYROXINE 50 MCG TABLET
50 MCG | Status: AC
Start: 2020-07-30 — End: ?

## 2020-07-31 ENCOUNTER — Encounter: Admit: 2020-07-31 | Payer: PRIVATE HEALTH INSURANCE | Attending: Rheumatology

## 2020-07-31 NOTE — Progress Notes
[facility] at [address] [place] [address]XBJYN: [phone]: [phone] Complaint: Initial Evaluation12/06/2021[name] is a 59 y.o. woman referred by Miranda Davenport (primary care) for positive ANA and hair loss. She was referred for evaluation of the positive ANA. This testing was prompted by her hair loss. The latter has been going on for about 6-8 months. She has since established with a dermatologist in [place] for this. She is now taking Nutrofol as a supplement and it seems to be helping. The hair loss was just in the middle (frontal to crown). This has improved, and is growing in thicker. She states that she feels well apart from the hair loss. Review of Systems (negative except as noted; positive findings in bold):Constitutional: weight changes, fatigue, malaise, fevers, chills, night sweatsSkin: rashes, photosensitivity, hives, easy bruisability, alopecia, skin nodules, psoriasis Eyes: pain, redness, itching, visual blurring, dryness, foreign body sensationENT: tinnitus, hearing loss, sinus congestion, loss of smell, bloody nose, oral ulcers, loss of taste, dry mouth, hoarsenessCardiac: chest pain, palpitations, exertional dyspneaVascular: Raynaud?s, chilblains, venous stasis, thrombosisPulmonary: shortness of breath, coughing, wheezing, hemoptysis, chest wall pain, pleuritic chest pain GI: dysphagia, nausea, vomiting, GERD, ulcers, abdominal pain, constipation, diarrhea, change in bowel habitsGU: dysuria, blood in urine, nocturia, infections, kidney stonesMusculoskeletal: morning stiffness, neck pain, back pain, joint pain, joint swelling, muscle aching or tenderness, muscle weaknessNeurological: numbness, tingling, headaches, fainting, dizziness, imbalance, memory loss, seizures, strokeHeme/Lymph: swollen or tender glands, anemiaPsychiatric:  anxiety, irritability, depression, sleep disturbancesRose Davenport has no past medical history on file. She had a myomectomy for uterine fibroids (2006). She has a history of hyperthyroidism and was treated with radio-iodide ablation for this (2010). She had a bowel perforation in 1986 while she was living in [place]. Current Medications: Current Outpatient Medications Medication Sig ? cholecalciferol (vitamin D3) Take 5,000 Units by mouth daily.  ? levothyroxine levothyroxine 50 mcg tablet ? rosuvastatin Take 20 mg by mouth. ? vitamin E acetate (VITAMIN E ORAL) vitamin E daily No current facility-administered medications for this visit. ** She is also taking the Nutrafol supplement. **Social History:Living Situation: she lives in [place], [place] with her husband; the rest of her family is in [place]. She is originally from [place]. 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 Illicits: none. Family History:Non-contributory. Physical Examination:Vitals: BP (!) 144/89  - Pulse 72  - Temp 97.3 ?F (36.3 ?C)  - Ht 5' 4 (1.626 m)  - Wt 84.3 kg  - SpO2 98%  - BMI 31.89 kg/m? General: alert and oriented; in no acute distress; very pleasant woman. HEENT: PERRL; no scleral icterus; no conjunctival injections; no scalp dermatitis; she has tightly braided hair in longitudinal rows; there is mild hair thinning at the front of the scalp though this is likely traction-related; there are no other patches of hair loss; MMM. Neck: supple; no lymphadenopathy; no thyromegaly.  Cardiac: normal rate; regular rhythm; no MRGs.Lungs: moving air well; lungs clear bilaterally.Back: no tenderness.Abdomen: normal bowel sounds; non-tender. GU: deferred.Extremities: no lower extremity edema.Skin: warm and dry skin; no rashes. Neuro: normal gross sensation; normal gross strength; normal stance and gait. MSK: no peripheral synovitis. Labs:Referral labs-BUN/creatinine were normal. Liver enzyme levels were normal. Ferritin was 146 ng/mL (normal). CBC was unremarkable. Thyroid function tests were normal. ANA was positive @1 :80 speckled pattern. Radiology: There are no new radiology studies available for review.Assessment and Summary: Miranda Davenport is a 59 y.o. female presenting for evaluation of a positive ANA. The titer was low and the pattern was non-specific. The ANA is probably  incidental, or else potentially related to her history of autoimmune thyroid disease (hyperthyroidism, which was treated with radio-iodide ablation). She does not otherwise have clinical features suggestive of systemic lupus erythematosus, Sjogren's syndrome, systemic sclerosis, or autoimmune myositis. Apart from incidental findings, ANAs can commonly be found in association with autoimmune thyroid diseases. Her hair loss was likely hormonal related (being in a frontal-to-crown distribution, without alopecia areata features and without scalp dermatitis which would go along with an inflammatory cause of hair loss. The hair loss seems to be essentially resolved at this point as well. She was understanding of the explanation and had no further questions. Recommendations:Labs: none at this time. Imaging: none at this time. Medications: no changes; continue current management. Vaccinations: she received the COVID vaccine series Proofreader) including booster dose.Contraception/Pregnancy: she is post-menopausal. Referrals: none at this time; she also follows up with a dermatologist. Next visit: Return if symptoms worsen or fail to improve.Thank you for involving our team in her care. Please contact me with any questions or concerns. Shannan Harper, MD, MHSInstructor of Medicine (Rheumatology)[facility] (Rheumatology)Section of Rheumatology, Allergy & ImmunologyDepartment of Internal MedicineEmail: [email or web address]

## 2020-09-04 ENCOUNTER — Other Ambulatory Visit: Payer: Self-pay | Admitting: Internal Medicine

## 2020-09-06 ENCOUNTER — Other Ambulatory Visit: Payer: Self-pay

## 2020-09-06 MED ORDER — LEVOTHYROXINE SODIUM 50 MCG PO TABS: 50.0000 ug | ORAL_TABLET | Freq: Every day | ORAL | 1 refills | Status: AC

## 2020-09-18 ENCOUNTER — Encounter: Payer: Self-pay | Admitting: Internal Medicine

## 2020-09-19 ENCOUNTER — Encounter: Payer: Self-pay | Admitting: Internal Medicine

## 2020-10-01 ENCOUNTER — Encounter: Payer: BLUE CROSS/BLUE SHIELD | Admitting: Internal Medicine

## 2020-10-01 LAB — HM MAMMOGRAPHY

## 2020-10-03 ENCOUNTER — Encounter: Payer: Self-pay | Admitting: Internal Medicine

## 2021-02-28 ENCOUNTER — Other Ambulatory Visit: Payer: Self-pay | Admitting: Internal Medicine

## 2021-05-30 ENCOUNTER — Inpatient Hospital Stay: Admit: 2021-05-30 | Discharge: 2021-05-30 | Payer: BLUE CROSS/BLUE SHIELD

## 2021-05-30 ENCOUNTER — Encounter: Admit: 2021-05-30 | Payer: PRIVATE HEALTH INSURANCE | Attending: Family Medicine

## 2021-05-30 DIAGNOSIS — R7303 Prediabetes: Secondary | ICD-10-CM

## 2021-05-30 DIAGNOSIS — I519 Heart disease, unspecified: Secondary | ICD-10-CM

## 2021-05-30 DIAGNOSIS — E785 Hyperlipidemia, unspecified: Secondary | ICD-10-CM

## 2021-05-30 DIAGNOSIS — E039 Hypothyroidism, unspecified: Secondary | ICD-10-CM

## 2021-05-30 LAB — COMPREHENSIVE METABOLIC PANEL
BKR A/G RATIO: 1.1
BKR ALANINE AMINOTRANSFERASE (ALT): 22 U/L (ref 12–78)
BKR ALBUMIN: 4.1 g/dL (ref 3.4–5.0)
BKR ALKALINE PHOSPHATASE: 58 U/L (ref 20–120)
BKR ANION GAP: 7 (ref 5–18)
BKR ASPARTATE AMINOTRANSFERASE (AST): 15 U/L (ref 5–37)
BKR AST/ALT RATIO: 0.7
BKR BILIRUBIN TOTAL: 0.7 mg/dL (ref 0.0–1.0)
BKR BLOOD UREA NITROGEN: 11 mg/dL (ref 8–25)
BKR BUN / CREAT RATIO: 14.5 (ref 8.0–25.0)
BKR CALCIUM: 9.3 mg/dL (ref 8.4–10.3)
BKR CHLORIDE: 104 mmol/L (ref 95–115)
BKR CO2: 27 mmol/L (ref 21–32)
BKR CREATININE: 0.76 mg/dL (ref 0.50–1.30)
BKR EGFR, CREATININE (CKD-EPI 2021): >60 mL/min/{1.73_m2} (ref >=60)
BKR GLOBULIN: 3.9 g/dL
BKR GLUCOSE: 75 mg/dL (ref 70–100)
BKR OSMOLALITY CALCULATION: 274 mOsm/kg — ABNORMAL LOW (ref 275–295)
BKR POTASSIUM: 4.1 mmol/L (ref 3.5–5.1)
BKR PROTEIN TOTAL: 8 g/dL (ref 6.4–8.2)

## 2021-05-30 LAB — LIPID PANEL
BKR CHOLESTEROL: 224 mg/dL — ABNORMAL HIGH (ref 50–200)
BKR HDL CHOLESTEROL: 54 mg/dL — ABNORMAL LOW (ref 40–90)
BKR LDL CHOLESTEROL SAMPSON CALCULATED: 156 mg/dL — ABNORMAL HIGH (ref 20–130)
BKR TRIGLYCERIDES: 81 mg/dL (ref 30–200)

## 2021-05-30 LAB — TSH
BKR THYROID STIMULATING HORMONE: 1.15 u[IU]/mL (ref 0.358–3.740)
BKR WAM MPV: 1.15 ??IU/mL (ref 0.358–3.740)

## 2021-05-30 LAB — HEMOGLOBIN A1C: BKR HEMOGLOBIN A1C: 5.8 %

## 2022-06-11 ENCOUNTER — Inpatient Hospital Stay: Admit: 2022-06-11 | Discharge: 2022-06-11 | Payer: BLUE CROSS/BLUE SHIELD

## 2022-06-11 ENCOUNTER — Encounter: Admit: 2022-06-11 | Payer: PRIVATE HEALTH INSURANCE

## 2022-06-11 ENCOUNTER — Ambulatory Visit: Admit: 2022-06-11 | Payer: BLUE CROSS/BLUE SHIELD

## 2022-06-11 DIAGNOSIS — I519 Heart disease, unspecified: Secondary | ICD-10-CM

## 2022-06-11 DIAGNOSIS — E785 Hyperlipidemia, unspecified: Secondary | ICD-10-CM

## 2022-06-11 DIAGNOSIS — E039 Hypothyroidism, unspecified: Secondary | ICD-10-CM

## 2022-06-11 LAB — LIPID PANEL
BKR CHOLESTEROL/HDL RATIO: 4.4 — ABNORMAL HIGH (ref 1.0–3.9)
BKR CHOLESTEROL: 242 mg/dL — ABNORMAL HIGH (ref 50–200)
BKR HDL CHOLESTEROL: 55 mg/dL (ref 40–90)
BKR LDL CHOLESTEROL SAMPSON CALCULATED: 170 mg/dL — ABNORMAL HIGH (ref 20–130)
BKR THYROID STIMULATING HORMONE: 55 mg/dL (ref 40–90)
BKR TRIGLYCERIDES: 95 mg/dL (ref 30–200)
